# Patient Record
Sex: Female | Born: 1960 | Race: Black or African American | Hispanic: No | Marital: Single | Smoking: Current some day smoker
Health system: Southern US, Community
[De-identification: ages and names within clinical notes are randomized; demographics above are authoritative.]

## PROBLEM LIST (undated history)

## (undated) DIAGNOSIS — E119 Type 2 diabetes mellitus without complications: Secondary | ICD-10-CM

## (undated) DIAGNOSIS — I10 Essential (primary) hypertension: Secondary | ICD-10-CM

## (undated) DIAGNOSIS — E7800 Pure hypercholesterolemia, unspecified: Secondary | ICD-10-CM

## (undated) HISTORY — PX: TOTAL KNEE ARTHROPLASTY: SHX125

---

## 2015-09-18 ENCOUNTER — Encounter (HOSPITAL_COMMUNITY): Payer: Self-pay

## 2015-09-18 ENCOUNTER — Emergency Department (HOSPITAL_COMMUNITY)
Admission: EM | Admit: 2015-09-18 | Discharge: 2015-09-18 | Disposition: A | Discharge status: Home / Self Care | Payer: Medicaid Other | Attending: Emergency Medicine | Admitting: Emergency Medicine

## 2015-09-18 ENCOUNTER — Emergency Department (HOSPITAL_COMMUNITY): Payer: Medicaid Other

## 2015-09-18 DIAGNOSIS — I10 Essential (primary) hypertension: Secondary | ICD-10-CM | POA: Insufficient documentation

## 2015-09-18 DIAGNOSIS — R1011 Right upper quadrant pain: Secondary | ICD-10-CM | POA: Diagnosis not present

## 2015-09-18 DIAGNOSIS — Z79899 Other long term (current) drug therapy: Secondary | ICD-10-CM | POA: Insufficient documentation

## 2015-09-18 DIAGNOSIS — R112 Nausea with vomiting, unspecified: Secondary | ICD-10-CM | POA: Diagnosis not present

## 2015-09-18 DIAGNOSIS — F17210 Nicotine dependence, cigarettes, uncomplicated: Secondary | ICD-10-CM | POA: Diagnosis not present

## 2015-09-18 DIAGNOSIS — Z7984 Long term (current) use of oral hypoglycemic drugs: Secondary | ICD-10-CM | POA: Diagnosis not present

## 2015-09-18 DIAGNOSIS — E119 Type 2 diabetes mellitus without complications: Secondary | ICD-10-CM | POA: Diagnosis not present

## 2015-09-18 DIAGNOSIS — R1013 Epigastric pain: Secondary | ICD-10-CM | POA: Diagnosis not present

## 2015-09-18 DIAGNOSIS — Z3202 Encounter for pregnancy test, result negative: Secondary | ICD-10-CM | POA: Insufficient documentation

## 2015-09-18 DIAGNOSIS — E7800 Pure hypercholesterolemia, unspecified: Secondary | ICD-10-CM | POA: Insufficient documentation

## 2015-09-18 DIAGNOSIS — R109 Unspecified abdominal pain: Secondary | ICD-10-CM

## 2015-09-18 HISTORY — DX: Type 2 diabetes mellitus without complications: E11.9

## 2015-09-18 HISTORY — DX: Essential (primary) hypertension: I10

## 2015-09-18 HISTORY — DX: Pure hypercholesterolemia, unspecified: E78.00

## 2015-09-18 LAB — CBC
HEMATOCRIT: 44.9 % (ref 36.0–46.0)
HEMOGLOBIN: 15.3 g/dL — AB (ref 12.0–15.0)
MCH: 27.4 pg (ref 26.0–34.0)
MCHC: 34.1 g/dL (ref 30.0–36.0)
MCV: 80.3 fL (ref 78.0–100.0)
Platelets: 298 10*3/uL (ref 150–400)
RBC: 5.59 MIL/uL — ABNORMAL HIGH (ref 3.87–5.11)
RDW: 13.9 % (ref 11.5–15.5)
WBC: 9.1 10*3/uL (ref 4.0–10.5)

## 2015-09-18 LAB — COMPREHENSIVE METABOLIC PANEL
ALBUMIN: 4.7 g/dL (ref 3.5–5.0)
ALT: 47 U/L (ref 14–54)
ANION GAP: 15 (ref 5–15)
AST: 42 U/L — AB (ref 15–41)
Alkaline Phosphatase: 84 U/L (ref 38–126)
BILIRUBIN TOTAL: 0.9 mg/dL (ref 0.3–1.2)
BUN: 10 mg/dL (ref 6–20)
CO2: 28 mmol/L (ref 22–32)
Calcium: 9.8 mg/dL (ref 8.9–10.3)
Chloride: 95 mmol/L — ABNORMAL LOW (ref 101–111)
Creatinine, Ser: 0.84 mg/dL (ref 0.44–1.00)
GFR calc Af Amer: >60 mL/min (ref >60)
GFR calc non Af Amer: >60 mL/min (ref >60)
GLUCOSE: 257 mg/dL — AB (ref 65–99)
POTASSIUM: 2.5 mmol/L — AB (ref 3.5–5.1)
SODIUM: 138 mmol/L (ref 135–145)
Total Protein: 8.6 g/dL — ABNORMAL HIGH (ref 6.5–8.1)

## 2015-09-18 LAB — URINALYSIS, ROUTINE W REFLEX MICROSCOPIC
BILIRUBIN URINE: NEGATIVE
HGB URINE DIPSTICK: NEGATIVE
KETONES UR: 40 mg/dL — AB
Nitrite: NEGATIVE
PH: 6 (ref 5.0–8.0)
Protein, ur: NEGATIVE mg/dL
SPECIFIC GRAVITY, URINE: 1.029 (ref 1.005–1.030)

## 2015-09-18 LAB — URINE MICROSCOPIC-ADD ON
BACTERIA UA: NONE SEEN
RBC / HPF: NONE SEEN RBC/hpf (ref 0–5)

## 2015-09-18 LAB — LIPASE, BLOOD: Lipase: 32 U/L (ref 11–51)

## 2015-09-18 LAB — MAGNESIUM: Magnesium: 1.9 mg/dL (ref 1.7–2.4)

## 2015-09-18 LAB — I-STAT BETA HCG BLOOD, ED (MC, WL, AP ONLY): I-stat hCG, quantitative: <5 m[IU]/mL (ref <5)

## 2015-09-18 MED ORDER — ACETAMINOPHEN 325 MG PO TABS: 650.0000 mg | ORAL_TABLET | Freq: Once | ORAL | Status: DC

## 2015-09-18 MED ORDER — SODIUM CHLORIDE 0.9 % IV BOLUS (SEPSIS)
1000.0000 mL | Freq: Once | INTRAVENOUS | Status: AC
  Administered 2015-09-18: 1000 mL via INTRAVENOUS

## 2015-09-18 MED ORDER — POTASSIUM CHLORIDE 10 MEQ/100ML IV SOLN
10.0000 meq | Freq: Once | INTRAVENOUS | Status: AC
  Administered 2015-09-18: 10 meq via INTRAVENOUS
  Filled 2015-09-18: qty 100

## 2015-09-18 MED ORDER — IOPAMIDOL (ISOVUE-300) INJECTION 61%
100.0000 mL | Freq: Once | INTRAVENOUS | Status: AC | PRN
  Administered 2015-09-18: 100 mL via INTRAVENOUS

## 2015-09-18 MED ORDER — ONDANSETRON 4 MG PO TBDP
4.0000 mg | ORAL_TABLET | Freq: Once | ORAL | Status: AC | PRN
  Administered 2015-09-18: 4 mg via ORAL
  Filled 2015-09-18: qty 1

## 2015-09-18 MED ORDER — ONDANSETRON 4 MG PO TBDP: 4.0000 mg | ORAL_TABLET | Freq: Three times a day (TID) | ORAL | Status: AC | PRN

## 2015-09-18 MED ORDER — PROMETHAZINE HCL 25 MG RE SUPP: 25.0000 mg | Freq: Four times a day (QID) | RECTAL | Status: AC | PRN

## 2015-09-18 MED ORDER — POTASSIUM CHLORIDE CRYS ER 20 MEQ PO TBCR
80.0000 meq | EXTENDED_RELEASE_TABLET | Freq: Once | ORAL | Status: AC
  Administered 2015-09-18: 80 meq via ORAL
  Filled 2015-09-18: qty 4

## 2015-09-18 MED ORDER — DIATRIZOATE MEGLUMINE & SODIUM 66-10 % PO SOLN
30.0000 mL | Freq: Once | ORAL | Status: AC
  Administered 2015-09-18: 30 mL via ORAL
  Filled 2015-09-18: qty 30

## 2015-09-18 NOTE — ED Provider Notes (Signed)
CSN: [id number]     Arrival date & time 09/18/15  1020 History   First MD Initiated Contact with Patient 09/18/15 1144     Chief Complaint  Patient presents with  . Emesis   Patient is a 55 y.o. female presenting with vomiting.  Emesis Duration:  2 months Timing:  Intermittent Quality:  Stomach contents Feeding tolerance: Salads and fruit. How soon after eating does vomiting occur:  10 minutes Progression:  Unchanged Chronicity:  New Recent urination:  Normal Context: not post-tussive and not self-induced   Relieved by:  None tried Ineffective treatments:  None tried Associated symptoms: no abdominal pain, no chills, no diarrhea, no fever, no headaches and no URI   Risk factors: diabetes   Risk factors: no prior abdominal surgery, no suspect food intake and no travel to endemic areas    Jackie Wright is a 55 year old female presenting with nausea and vomiting. She reports onset of symptoms was one and a half months ago. She states that she is unable to keep any foods down. Occasionally she can keep a salad or piece of fruit down. She has tried protein shakes and states that she vomits all liquids at this well. She reports onset of nausea approximately 10 minutes after eating. She denies sensation of food getting stuck in her esophagus or regurgitating food. She denies associated fevers, chills, dizziness, syncope, abdominal pain, diarrhea or dysuria. She does note that she was started on Invokana,, hydrochlorothiazide, metoprolol and lovastatin around the same time. She has not seen her PCP for these symptoms. She states the only reason she is here today is because her son forced her to come in. She has no other complaints today.  Past Medical History  Diagnosis Date  . Diabetes mellitus without complication (HCC)   . Hypertension   . High cholesterol    Past Surgical History  Procedure Laterality Date  . Total knee arthroplasty Bilateral    History reviewed. No pertinent family  history. Social History  Substance Use Topics  . Smoking status: Current Some Day Smoker    Types: Cigarettes  . Smokeless tobacco: None  . Alcohol Use: No   OB History    No data available     Review of Systems  Constitutional: Negative for chills.  Gastrointestinal: Positive for vomiting. Negative for abdominal pain and diarrhea.  Neurological: Negative for headaches.  All other systems reviewed and are negative.     Allergies  Review of patient's allergies indicates no known allergies.  Home Medications   Prior to Admission medications   Medication Sig Start Date End Date Taking? Authorizing Provider  canagliflozin (INVOKANA) 100 MG TABS tablet Take by mouth daily before breakfast.   Yes Historical Provider, MD  gabapentin (NEURONTIN) 600 MG tablet Take 600 mg by mouth daily.   Yes Historical Provider, MD  hydrochlorothiazide (HYDRODIURIL) 25 MG tablet Take 25 mg by mouth daily.   Yes Historical Provider, MD  lovastatin (MEVACOR) 20 MG tablet Take 20 mg by mouth daily.   Yes Historical Provider, MD  metFORMIN (GLUCOPHAGE) 500 MG tablet Take 1,000 mg by mouth daily.   Yes Historical Provider, MD  metoprolol succinate (TOPROL-XL) 25 MG 24 hr tablet Take 25 mg by mouth daily.   Yes Historical Provider, MD  ondansetron (ZOFRAN ODT) 4 MG disintegrating tablet Take 1 tablet (4 mg total) by mouth every 8 (eight) hours as needed for nausea or vomiting. 09/18/15   Vontrell Pullman, PA-C  promethazine (PHENERGAN) 25 MG suppository Place  1 suppository (25 mg total) rectally every 6 (six) hours as needed for nausea or vomiting. 09/18/15   Shawana Knoch, PA-C   BP 118/74 mmHg  Pulse 87  Temp(Src) 98.5 F (36.9 C) (Oral)  Resp 16  SpO2 98% Physical Exam  Constitutional: She appears well-developed and well-nourished. No distress.  HENT:  Head: Normocephalic and atraumatic.  Eyes: Conjunctivae are normal. Right eye exhibits no discharge. Left eye exhibits no discharge. No scleral  icterus.  Neck: Normal range of motion.  Cardiovascular: Normal rate, regular rhythm and normal heart sounds.   Pulmonary/Chest: Effort normal and breath sounds normal. No respiratory distress.  Abdominal: Soft. She exhibits no distension. There is tenderness. There is no rebound and no guarding.  Tenderness to palpation in the right upper quadrant and epigastric regions.  Musculoskeletal: Normal range of motion.  Neurological: She is alert. Coordination normal.  Skin: Skin is warm and dry.  Psychiatric: She has a normal mood and affect. Her behavior is normal.  Nursing note and vitals reviewed.   ED Course  Procedures (including critical care time) Labs Review Labs Reviewed  COMPREHENSIVE METABOLIC PANEL - Abnormal; Notable for the following:    Potassium 2.5 (*)    Chloride 95 (*)    Glucose, Bld 257 (*)    Total Protein 8.6 (*)    AST 42 (*)    All other components within normal limits  CBC - Abnormal; Notable for the following:    RBC 5.59 (*)    Hemoglobin 15.3 (*)    All other components within normal limits  URINALYSIS, ROUTINE W REFLEX MICROSCOPIC (NOT AT [facility]) - Abnormal; Notable for the following:    APPearance CLOUDY (*)    Glucose, UA >1000 (*)    Ketones, ur 40 (*)    Leukocytes, UA SMALL (*)    All other components within normal limits  URINE MICROSCOPIC-ADD ON - Abnormal; Notable for the following:    Squamous Epithelial / LPF TOO NUMEROUS TO COUNT (*)    All other components within normal limits  LIPASE, BLOOD  MAGNESIUM  I-STAT BETA HCG BLOOD, ED ([facility], [facility], [facility] ONLY)    Imaging Review Ct Abdomen Pelvis W Contrast  09/18/2015  CLINICAL DATA:  Emesis over the last 1.5 months.  Abdominal pain. EXAM: CT ABDOMEN AND PELVIS WITH CONTRAST TECHNIQUE: Multidetector CT imaging of the abdomen and pelvis was performed using the standard protocol following bolus administration of intravenous contrast. CONTRAST:  ISOVUE-300 IOPAMIDOL (ISOVUE-300) INJECTION 61%  COMPARISON:  None. FINDINGS: Lower chest: Moderate dependent atelectasis is present above lung bases. The heart is within normal limits for size. There is no significant pleural or pericardial effusion. Hepatobiliary: Mild diffuse fatty infiltration of the liver is evident. No focal hepatic lesions are present. The gallbladder and common bile duct are within normal limits. Pancreas: There is slight prominence of the pancreatic duct. No obstructing lesion or mass is evident. Spleen: Within normal limits Adrenals/Urinary Tract: Calcifications are present within both adrenal glands there is no mass lesion. The kidneys and ureters are within normal limits bilaterally. The urinary bladder is unremarkable. Stomach/Bowel: The stomach and duodenum are within normal limits. The small bowel is unremarkable. The appendix and cecum are within normal limits. The ascending colon and transverse colon are unremarkable. The descending colon demonstrates some diverticular change. More prominent diverticular changes are evident throughout the sigmoid colon. There is no focal inflammation to suggest diverticulitis. Vascular/Lymphatic: No significant vascular calcifications are present. There is no significant adenopathy.  Reproductive: Multiple heterogeneous uterine fibroids are present. There are 2 large fibroids at the fundus measuring up to 4.2 cm. A posterior fibroid on the right measures 4.0 cm. Multiple smaller fibroids are noted as well. The adnexa are within normal limits bilaterally. Other: No significant free fluid present. A small paraumbilical hernia contains fat but no bowel. Musculoskeletal: The bone windows are unremarkable. IMPRESSION: 1. Multiple enlarged uterine fibroids.  These could be symptomatic. 2. Go descending and sigmoid diverticulosis without diverticulitis. 3. Calcifications of the adrenal glands bilaterally. This may represent prior hemorrhage or less likely infection. Other granulomatous changes are not  evident. 4. Mild diffuse fatty infiltration of the liver. 5. Mild pancreatic duct dilation. No obstructing lesion or mass is present. Lipase levels are normal. Electronically Signed   By: Marin Robertshristopher  Mattern M.D.   On: 09/18/2015 14:49   I have personally reviewed and evaluated these images and lab results as part of my medical decision-making.   EKG Interpretation None      MDM   Final diagnoses:  Abdominal pain  Non-intractable vomiting with nausea, vomiting of unspecified type   55 year old female presenting with nausea and vomiting 1.5 months. Afebrile and hemodynamically stable. Patient is nontoxic-appearing. Abdomen is soft with tenderness in the epigastric and right upper quadrant regions. No peritoneal signs. No leukocytosis. Potassium is 2.5 which was repleted in the emergency department with 80 mEq orally and 10 IV. Magnesium 1.9. UA without infection. EKG without significant changes. CT abdomen with incidental findings but no definitive etiology of her symptoms. Given history of diabetes, could be gastroparesis. Patient was also started on 4 new medications within the past 2 months and this could be potential intolerance. Nausea resolved with Zofran in emergency department. We'll discharge with antiemetics and close PCP followed up. Discussed with patient that her potassium was low today and it will need to be rechecked by her PCP. I have also encouraged her to continue her medications until discussing potential side effects with her PCP. Return precautions given in discharge paperwork and discussed with pt at bedside. Pt stable for discharge     Jackie HeimlichStevi Saran Laviolette, PA-C 09/18/15 1523  Melene Planan Floyd, DO 09/18/15 1528

## 2015-09-18 NOTE — ED Notes (Signed)
Pt gave e-signature, pad not working in room. Pt verbalized understanding of d/c paperwork.

## 2015-09-18 NOTE — Discharge Instructions (Signed)
Schedule a follow up with your PCP. Your potassium was low today and will need to be rechecked. Continue taking your home medications and discuss possible side effects with your PCP. Return to ED with new, worsening or concerning symptoms.    Nausea and Vomiting Nausea is a sick feeling that often comes before throwing up (vomiting). Vomiting is a reflex where stomach contents come out of your mouth. Vomiting can cause severe loss of body fluids (dehydration). Children and elderly adults can become dehydrated quickly, especially if they also have diarrhea. Nausea and vomiting are symptoms of a condition or disease. It is important to find the cause of your symptoms. CAUSES   Direct irritation of the stomach lining. This irritation can result from increased acid production (gastroesophageal reflux disease), infection, food poisoning, taking certain medicines (such as nonsteroidal anti-inflammatory drugs), alcohol use, or tobacco use.  Signals from the brain.These signals could be caused by a headache, heat exposure, an inner ear disturbance, increased pressure in the brain from injury, infection, a tumor, or a concussion, pain, emotional stimulus, or metabolic problems.  An obstruction in the gastrointestinal tract (bowel obstruction).  Illnesses such as diabetes, hepatitis, gallbladder problems, appendicitis, kidney problems, cancer, sepsis, atypical symptoms of a heart attack, or eating disorders.  Medical treatments such as chemotherapy and radiation.  Receiving medicine that makes you sleep (general anesthetic) during surgery. DIAGNOSIS Your caregiver may ask for tests to be done if the problems do not improve after a few days. Tests may also be done if symptoms are severe or if the reason for the nausea and vomiting is not clear. Tests may include:  Urine tests.  Blood tests.  Stool tests.  Cultures (to look for evidence of infection).  X-rays or other imaging studies. Test results  can help your caregiver make decisions about treatment or the need for additional tests. TREATMENT You need to stay well hydrated. Drink frequently but in small amounts.You may wish to drink water, sports drinks, clear broth, or eat frozen ice pops or gelatin dessert to help stay hydrated.When you eat, eating slowly may help prevent nausea.There are also some antinausea medicines that may help prevent nausea. HOME CARE INSTRUCTIONS   Take all medicine as directed by your caregiver.  If you do not have an appetite, do not force yourself to eat. However, you must continue to drink fluids.  If you have an appetite, eat a normal diet unless your caregiver tells you differently.  Eat a variety of complex carbohydrates (rice, wheat, potatoes, bread), lean meats, yogurt, fruits, and vegetables.  Avoid high-fat foods because they are more difficult to digest.  Drink enough water and fluids to keep your urine clear or pale yellow.  If you are dehydrated, ask your caregiver for specific rehydration instructions. Signs of dehydration may include:  Severe thirst.  Dry lips and mouth.  Dizziness.  Dark urine.  Decreasing urine frequency and amount.  Confusion.  Rapid breathing or pulse. SEEK IMMEDIATE MEDICAL CARE IF:   You have blood or brown flecks (like coffee grounds) in your vomit.  You have black or bloody stools.  You have a severe headache or stiff neck.  You are confused.  You have severe abdominal pain.  You have chest pain or trouble breathing.  You do not urinate at least once every 8 hours.  You develop cold or clammy skin.  You continue to vomit for longer than 24 to 48 hours.  You have a fever. MAKE SURE YOU:  Understand these instructions.  Will watch your condition.  Will get help right away if you are not doing well or get worse.   This information is not intended to replace advice given to you by your health care provider. Make sure you discuss  any questions you have with your health care provider.   Document Released: 05/20/2005 Document Revised: 08/12/2011 Document Reviewed: 10/17/2010 Elsevier Interactive Patient Education [employer]2016 Elsevier Inc.

## 2015-09-18 NOTE — Progress Notes (Signed)
Entered in d/c instructionsEntered in d/c instructions  [facility] This is your assigned Medicaid [place] access doctor If you prefer to see another Medicaid doctor other than the one on your Medicaid card PLEASE CALL DSS [phone] or [phone] [facility] [address] [place], [place] [id number] [phone] Sandi Mariscalmedicaid [place] access patient [place]: [phone] [address] [place], [place] [address] [email or web address].ncdhhs.gov/ Use this website to assist with understanding your coverage & to renew application As a Medicaid client you MUST contact DSS/SSI each time you change address, move to another [place] county or another state to keep your address updated  [place] Transportation to Dr appts if you are have full Medicaid: [phone], [phone]

## 2015-09-18 NOTE — Progress Notes (Signed)
Pt noted with Sandi Mariscalmedicaid [place] access, [place] address and [facility]cp [facility] [address] [place], [place] [id number] [phone] Pt states she is only visiting with her son at this time [place]  Pt offered a list of medicaid [place] providers but encouraged her to return to her assigned medicaid pcp to prevent a bill  Pt voiced understanding and appreciation for the list of [place] medicaid providers in case she re locates Pt asked to eat a snack Cm informed her her ED RN would be consulted  Pt not allowed to eat at this time and made aware

## 2015-09-18 NOTE — ED Notes (Signed)
Pt c/o emesis x 1.5 months.  Denies pain.  Pt reports that she was started on Invokana, HCTZ, Metoprolol, and Lovastatin x 1.5 months ago.

## 2017-04-01 IMAGING — CT CT ABD-PELV W/ CM
2 of 5 series · 15 of 46 positions shown, 17 images · IV contrast (ISOVUE)
Comparison: None.

CLINICAL DATA: Emesis over the last 1.5 months.  Abdominal pain.

EXAM:
CT ABDOMEN AND PELVIS WITH CONTRAST
TECHNIQUE: Multidetector CT imaging of the abdomen and pelvis was performed
using the standard protocol following bolus administration of
intravenous contrast.
CONTRAST:  100mL YPM0H2-NKK IOPAMIDOL (YPM0H2-NKK) INJECTION 61%

[Series 2: abd/pel with · axial · 0.89mm/px · z∈[-343,+62]mm · 12 of 93 slices shown, 14 images]
[im 6/93  soft-tissue]
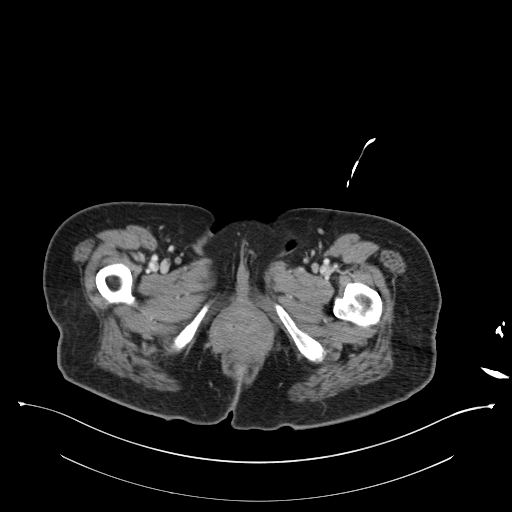
[im 6/93  bone]
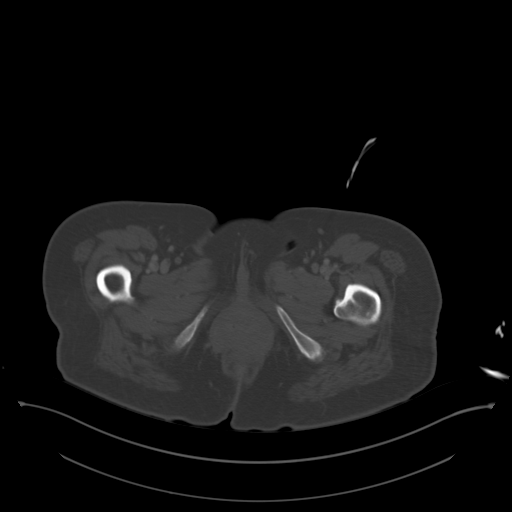
[im 16/93  soft-tissue]
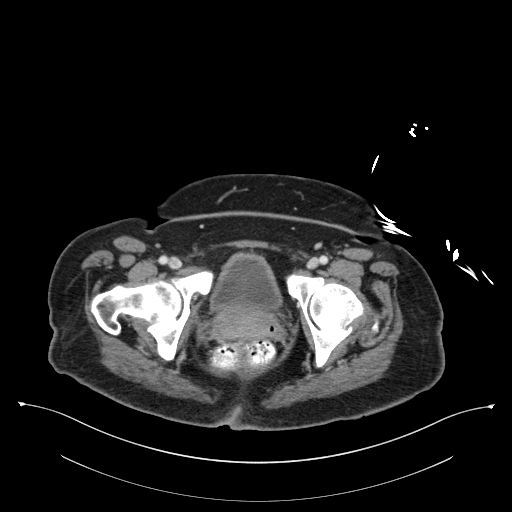
[im 21/93  soft-tissue]
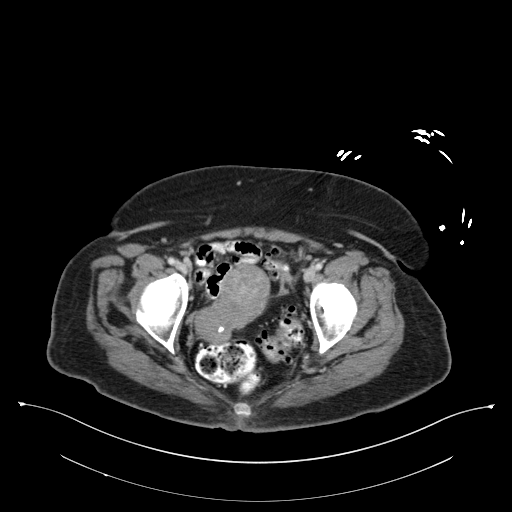
[im 26/93  soft-tissue]
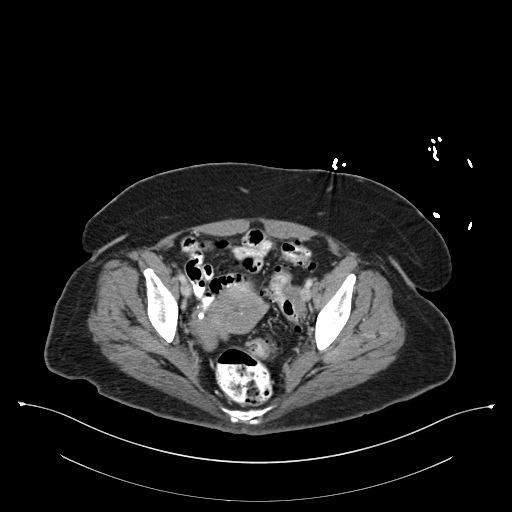
[im 36/93  soft-tissue]
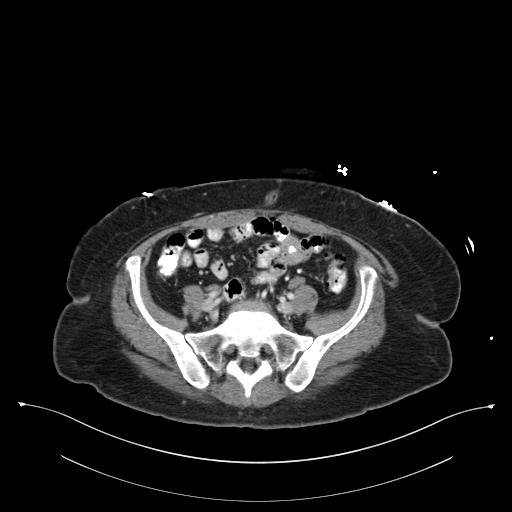
[im 41/93  soft-tissue]
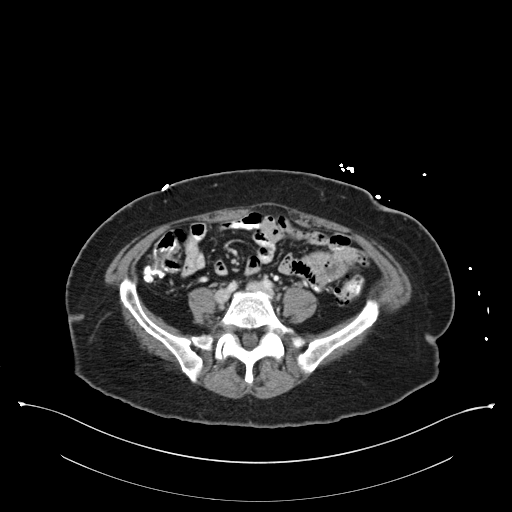
[im 52/93  soft-tissue]
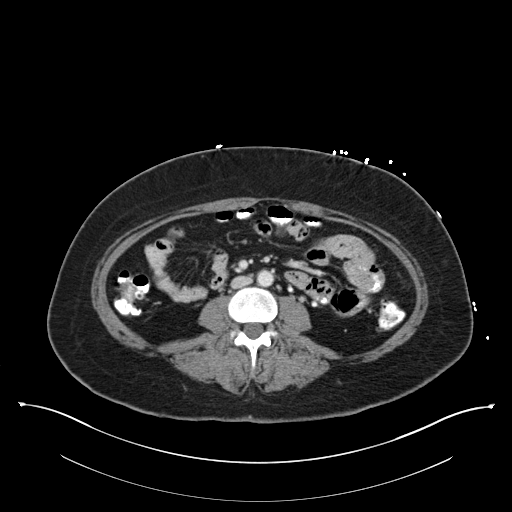
[im 57/93  soft-tissue]
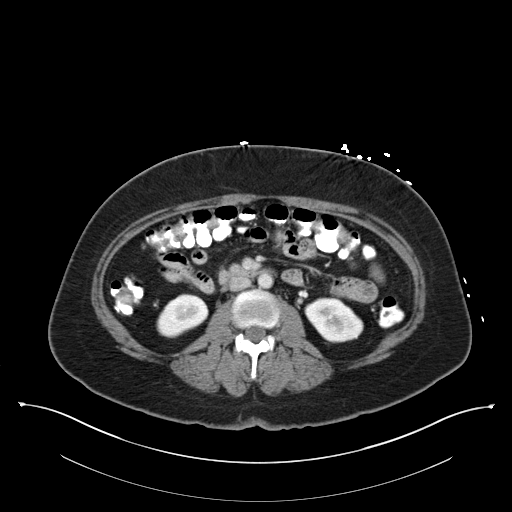
[im 67/93  soft-tissue]
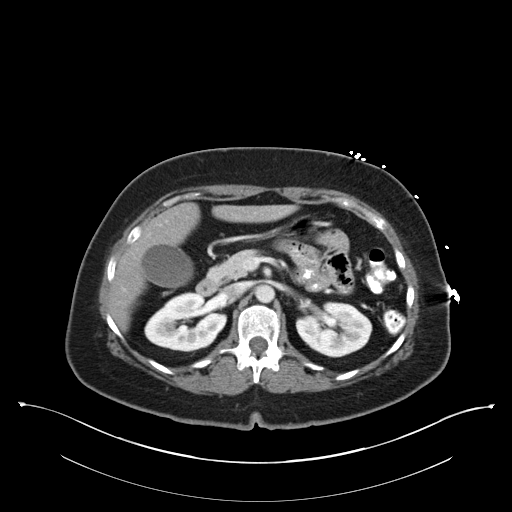
[im 67/93  bone]
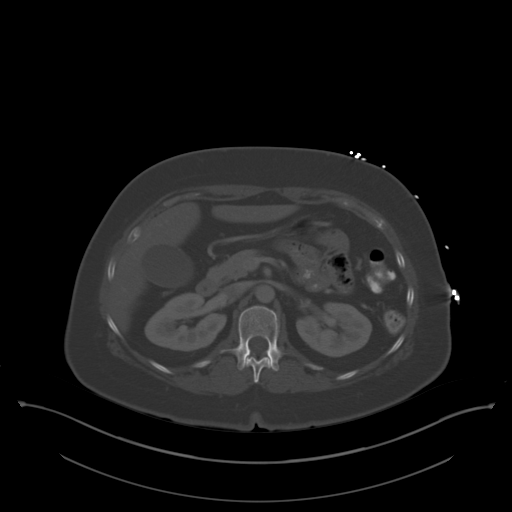
[im 72/93  soft-tissue]
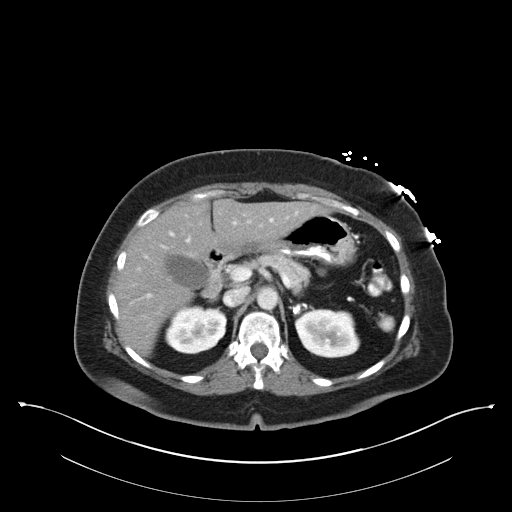
[im 77/93  soft-tissue]
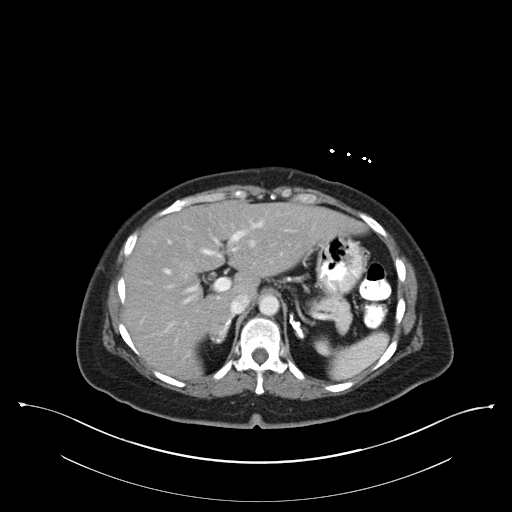
[im 87/93  soft-tissue]
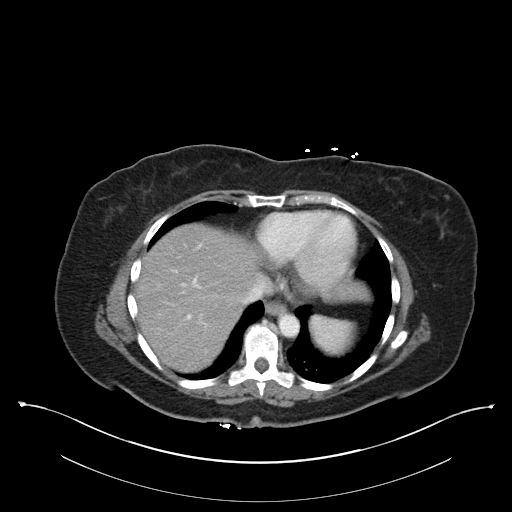

[Series 6: coronal a/|p · coronal · 0.74mm/px · 3 of 132 slices shown]
[im 44/132  soft-tissue]
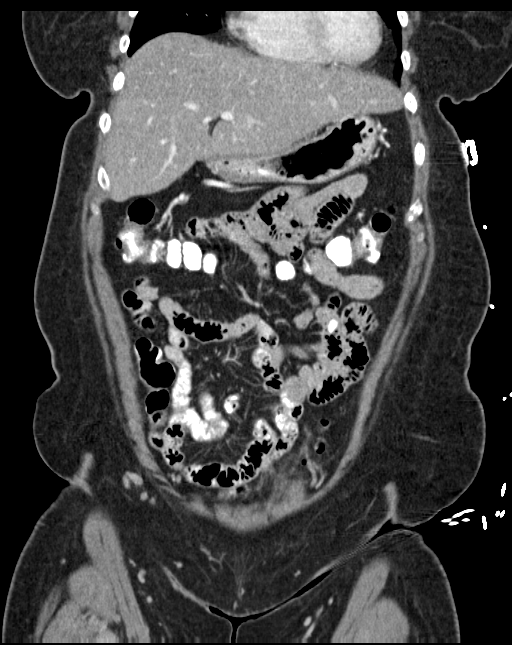
[im 59/132  soft-tissue]
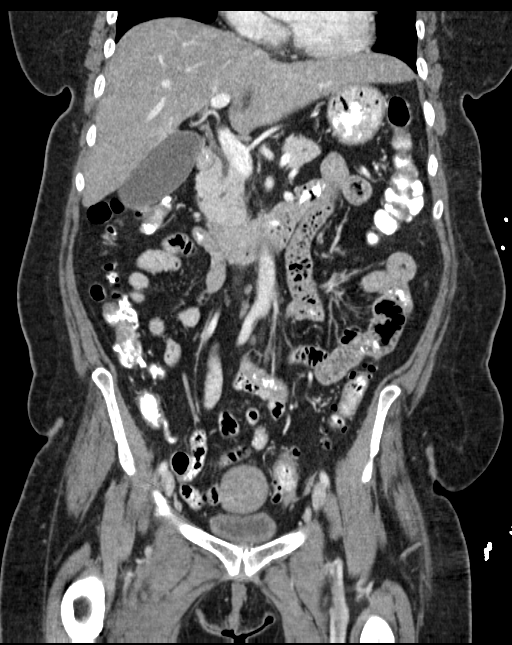
[im 73/132  soft-tissue]
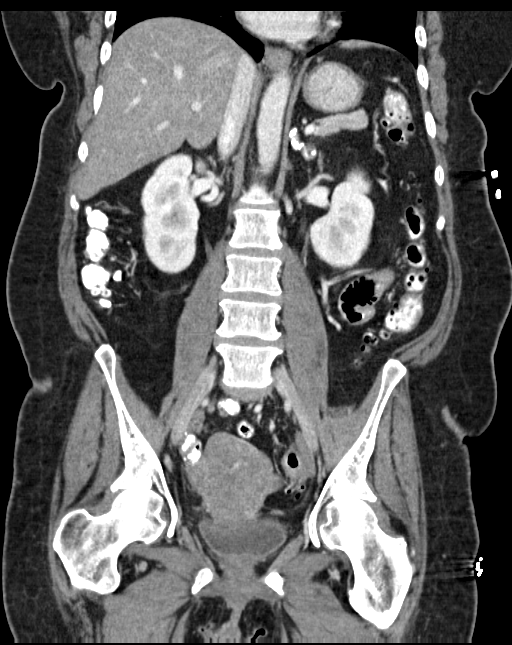

[15 of 46 positions shown; findings below may reference images not displayed]

FINDINGS: Lower chest: Moderate dependent atelectasis is present above lung
bases. The heart is within normal limits for size. There is no
significant pleural or pericardial effusion.

Hepatobiliary: Mild diffuse fatty infiltration of the liver is
evident. No focal hepatic lesions are present. The gallbladder and
common bile duct are within normal limits.

Pancreas: There is slight prominence of the pancreatic duct. No
obstructing lesion or mass is evident.

Spleen: Within normal limits

Adrenals/Urinary Tract: Calcifications are present within both
adrenal glands there is no mass lesion. The kidneys and ureters are
within normal limits bilaterally. The urinary bladder is
unremarkable.

Stomach/Bowel: The stomach and duodenum are within normal limits.
The small bowel is unremarkable. The appendix and cecum are within
normal limits. The ascending colon and transverse colon are
unremarkable. The descending colon demonstrates some diverticular
change. More prominent diverticular changes are evident throughout
the sigmoid colon. There is no focal inflammation to suggest
diverticulitis.

Vascular/Lymphatic: No significant vascular calcifications are
present. There is no significant adenopathy.

Reproductive: Multiple heterogeneous uterine fibroids are present.
There are 2 large fibroids at the fundus measuring up to 4.2 cm. A
posterior fibroid on the right measures 4.0 cm. Multiple smaller
fibroids are noted as well. The adnexa are within normal limits
bilaterally.

Other: No significant free fluid present. A small paraumbilical
hernia contains fat but no bowel.

Musculoskeletal: The bone windows are unremarkable.
IMPRESSION: 1. Multiple enlarged uterine fibroids.  These could be symptomatic.
2. Go descending and sigmoid diverticulosis without diverticulitis.
3. Calcifications of the adrenal glands bilaterally. This may
represent prior hemorrhage or less likely infection. Other
granulomatous changes are not evident.
4. Mild diffuse fatty infiltration of the liver.
5. Mild pancreatic duct dilation. No obstructing lesion or mass is
present. Lipase levels are normal.
